# Patient Record
Sex: Male | Born: 1960 | Race: White | Hispanic: No | Marital: Married | Smoking: Never smoker
Health system: Southern US, Community
[De-identification: ages and names within clinical notes are randomized; demographics above are authoritative.]

## PROBLEM LIST (undated history)

## (undated) DIAGNOSIS — Z8481 Family history of carrier of genetic disease: Secondary | ICD-10-CM

## (undated) DIAGNOSIS — Z807 Family history of other malignant neoplasms of lymphoid, hematopoietic and related tissues: Secondary | ICD-10-CM

## (undated) DIAGNOSIS — Z801 Family history of malignant neoplasm of trachea, bronchus and lung: Secondary | ICD-10-CM

## (undated) DIAGNOSIS — C801 Malignant (primary) neoplasm, unspecified: Secondary | ICD-10-CM

## (undated) DIAGNOSIS — Z808 Family history of malignant neoplasm of other organs or systems: Secondary | ICD-10-CM

## (undated) DIAGNOSIS — Z803 Family history of malignant neoplasm of breast: Secondary | ICD-10-CM

## (undated) DIAGNOSIS — I10 Essential (primary) hypertension: Secondary | ICD-10-CM

## (undated) DIAGNOSIS — Z85820 Personal history of malignant melanoma of skin: Secondary | ICD-10-CM

## (undated) HISTORY — DX: Family history of malignant neoplasm of breast: Z80.3

## (undated) HISTORY — DX: Family history of other malignant neoplasms of lymphoid, hematopoietic and related tissues: Z80.7

## (undated) HISTORY — DX: Family history of malignant neoplasm of trachea, bronchus and lung: Z80.1

## (undated) HISTORY — DX: Family history of malignant neoplasm of other organs or systems: Z80.8

## (undated) HISTORY — PX: ABDOMINAL SURGERY: SHX537

## (undated) HISTORY — DX: Personal history of malignant melanoma of skin: Z85.820

## (undated) HISTORY — DX: Family history of carrier of genetic disease: Z84.81

---

## 1998-02-07 ENCOUNTER — Encounter: Payer: Self-pay | Admitting: Emergency Medicine

## 1998-02-07 ENCOUNTER — Inpatient Hospital Stay (HOSPITAL_COMMUNITY): Admission: EM | Admit: 1998-02-07 | Discharge: 1998-02-13 | Payer: Self-pay | Admitting: Emergency Medicine

## 2002-08-26 ENCOUNTER — Ambulatory Visit (HOSPITAL_COMMUNITY): Admission: RE | Admit: 2002-08-26 | Discharge: 2002-08-26 | Payer: Self-pay | Admitting: Gastroenterology

## 2002-08-26 ENCOUNTER — Encounter: Payer: Self-pay | Admitting: Gastroenterology

## 2010-08-09 ENCOUNTER — Other Ambulatory Visit: Payer: Self-pay | Admitting: General Surgery

## 2010-08-09 DIAGNOSIS — R599 Enlarged lymph nodes, unspecified: Secondary | ICD-10-CM

## 2010-08-11 ENCOUNTER — Other Ambulatory Visit: Payer: Self-pay | Admitting: General Surgery

## 2010-08-11 ENCOUNTER — Ambulatory Visit
Admission: RE | Admit: 2010-08-11 | Discharge: 2010-08-11 | Disposition: A | Discharge status: Home / Self Care | Payer: Managed Care, Other (non HMO) | Source: Ambulatory Visit | Attending: General Surgery | Admitting: General Surgery

## 2010-08-11 ENCOUNTER — Other Ambulatory Visit: Payer: Self-pay | Admitting: Diagnostic Radiology

## 2010-08-11 DIAGNOSIS — R599 Enlarged lymph nodes, unspecified: Secondary | ICD-10-CM

## 2010-08-24 ENCOUNTER — Encounter: Payer: Managed Care, Other (non HMO) | Admitting: Hematology and Oncology

## 2010-08-31 ENCOUNTER — Encounter (HOSPITAL_BASED_OUTPATIENT_CLINIC_OR_DEPARTMENT_OTHER): Payer: Managed Care, Other (non HMO) | Admitting: Hematology and Oncology

## 2010-08-31 ENCOUNTER — Other Ambulatory Visit: Payer: Self-pay | Admitting: Hematology and Oncology

## 2010-08-31 DIAGNOSIS — C7A8 Other malignant neuroendocrine tumors: Secondary | ICD-10-CM

## 2010-08-31 DIAGNOSIS — C7A1 Malignant poorly differentiated neuroendocrine tumors: Secondary | ICD-10-CM

## 2010-08-31 LAB — CBC WITH DIFFERENTIAL/PLATELET
BASO%: 1.3 % (ref 0.0–2.0)
EOS%: 2.6 % (ref 0.0–7.0)
LYMPH%: 33.9 % (ref 14.0–49.0)
MCHC: 34 g/dL (ref 32.0–36.0)
MCV: 87.1 fL (ref 79.3–98.0)
MONO%: 11 % (ref 0.0–14.0)
Platelets: 344 10*3/uL (ref 140–400)
RBC: 5.13 10*6/uL (ref 4.20–5.82)
RDW: 14.7 % — ABNORMAL HIGH (ref 11.0–14.6)
nRBC: 0 % (ref 0–0)

## 2010-09-02 ENCOUNTER — Other Ambulatory Visit (HOSPITAL_COMMUNITY): Payer: Managed Care, Other (non HMO)

## 2010-09-03 LAB — COMPREHENSIVE METABOLIC PANEL
ALT: 32 U/L (ref 0–53)
AST: 34 U/L (ref 0–37)
Alkaline Phosphatase: 62 U/L (ref 39–117)
Creatinine, Ser: 0.94 mg/dL (ref 0.40–1.50)
Sodium: 138 mEq/L (ref 135–145)
Total Bilirubin: 0.7 mg/dL (ref 0.3–1.2)

## 2010-09-03 LAB — LACTATE DEHYDROGENASE: LDH: 162 U/L (ref 94–250)

## 2010-09-06 ENCOUNTER — Other Ambulatory Visit: Payer: Self-pay | Admitting: Hematology and Oncology

## 2010-09-07 ENCOUNTER — Other Ambulatory Visit (HOSPITAL_COMMUNITY): Payer: Managed Care, Other (non HMO)

## 2010-09-09 LAB — 5 HIAA, QUANTITATIVE, URINE, 24 HOUR
5-HIAA, 24 Hr Urine: 5 mg/24 h (ref <=6.0)
Volume, Urine-5HIAA: 2800 mL/24 h

## 2010-09-10 ENCOUNTER — Other Ambulatory Visit (HOSPITAL_COMMUNITY): Payer: Managed Care, Other (non HMO)

## 2010-09-21 ENCOUNTER — Other Ambulatory Visit: Payer: Self-pay | Admitting: Hematology and Oncology

## 2010-09-21 ENCOUNTER — Other Ambulatory Visit: Payer: Self-pay | Admitting: Gastroenterology

## 2010-09-21 DIAGNOSIS — D3A8 Other benign neuroendocrine tumors: Secondary | ICD-10-CM

## 2010-09-27 ENCOUNTER — Ambulatory Visit
Admission: RE | Admit: 2010-09-27 | Discharge: 2010-09-27 | Disposition: A | Discharge status: Home / Self Care | Payer: Managed Care, Other (non HMO) | Source: Ambulatory Visit | Attending: Hematology and Oncology | Admitting: Hematology and Oncology

## 2010-09-27 DIAGNOSIS — D3A8 Other benign neuroendocrine tumors: Secondary | ICD-10-CM

## 2010-09-27 MED ORDER — IOHEXOL 300 MG/ML  SOLN
125.0000 mL | Freq: Once | INTRAMUSCULAR | Status: AC | PRN
  Administered 2010-09-27: 125 mL via INTRAVENOUS

## 2014-07-23 ENCOUNTER — Other Ambulatory Visit: Payer: Self-pay | Admitting: Gastroenterology

## 2017-06-18 ENCOUNTER — Encounter (HOSPITAL_COMMUNITY): Payer: Self-pay

## 2017-06-18 ENCOUNTER — Emergency Department (HOSPITAL_COMMUNITY): Payer: Managed Care, Other (non HMO)

## 2017-06-18 ENCOUNTER — Other Ambulatory Visit: Payer: Self-pay

## 2017-06-18 ENCOUNTER — Observation Stay (HOSPITAL_COMMUNITY)
Admission: EM | Admit: 2017-06-18 | Discharge: 2017-06-19 | Disposition: A | Discharge status: Home / Self Care | Payer: Managed Care, Other (non HMO) | Attending: Cardiology | Admitting: Cardiology

## 2017-06-18 DIAGNOSIS — I25110 Atherosclerotic heart disease of native coronary artery with unstable angina pectoris: Principal | ICD-10-CM | POA: Insufficient documentation

## 2017-06-18 DIAGNOSIS — Z8249 Family history of ischemic heart disease and other diseases of the circulatory system: Secondary | ICD-10-CM

## 2017-06-18 DIAGNOSIS — Z7982 Long term (current) use of aspirin: Secondary | ICD-10-CM | POA: Diagnosis not present

## 2017-06-18 DIAGNOSIS — I200 Unstable angina: Secondary | ICD-10-CM | POA: Diagnosis not present

## 2017-06-18 DIAGNOSIS — Z79899 Other long term (current) drug therapy: Secondary | ICD-10-CM | POA: Insufficient documentation

## 2017-06-18 DIAGNOSIS — I10 Essential (primary) hypertension: Secondary | ICD-10-CM | POA: Insufficient documentation

## 2017-06-18 HISTORY — DX: Malignant (primary) neoplasm, unspecified: C80.1

## 2017-06-18 HISTORY — DX: Essential (primary) hypertension: I10

## 2017-06-18 LAB — BASIC METABOLIC PANEL
Anion gap: 11 (ref 5–15)
BUN: 14 mg/dL (ref 6–20)
CO2: 24 mmol/L (ref 22–32)
Calcium: 9.2 mg/dL (ref 8.9–10.3)
Chloride: 102 mmol/L (ref 101–111)
Creatinine, Ser: 1.01 mg/dL (ref 0.61–1.24)
GFR calc Af Amer: >60 mL/min (ref >60)
GFR calc non Af Amer: >60 mL/min (ref >60)
Glucose, Bld: 102 mg/dL — ABNORMAL HIGH (ref 65–99)
Potassium: 4.1 mmol/L (ref 3.5–5.1)
Sodium: 137 mmol/L (ref 135–145)

## 2017-06-18 LAB — TROPONIN I
Troponin I: <0.03 ng/mL (ref <0.03)
Troponin I: <0.03 ng/mL (ref <0.03)

## 2017-06-18 LAB — CBC
HCT: 43.6 % (ref 39.0–52.0)
Hemoglobin: 14.9 g/dL (ref 13.0–17.0)
MCH: 33.1 pg (ref 26.0–34.0)
MCHC: 34.2 g/dL (ref 30.0–36.0)
MCV: 96.9 fL (ref 78.0–100.0)
Platelets: 341 10*3/uL (ref 150–400)
RBC: 4.5 MIL/uL (ref 4.22–5.81)
RDW: 13.5 % (ref 11.5–15.5)
WBC: 5.1 10*3/uL (ref 4.0–10.5)

## 2017-06-18 LAB — I-STAT TROPONIN, ED: Troponin i, poc: 0 ng/mL (ref 0.00–0.08)

## 2017-06-18 LAB — HEPARIN LEVEL (UNFRACTIONATED): HEPARIN UNFRACTIONATED: 0.46 [IU]/mL (ref 0.30–0.70)

## 2017-06-18 MED ORDER — NITROGLYCERIN 0.4 MG SL SUBL: 0.4000 mg | SUBLINGUAL_TABLET | SUBLINGUAL | Status: DC | PRN

## 2017-06-18 MED ORDER — LISINOPRIL 2.5 MG PO TABS
2.5000 mg | ORAL_TABLET | Freq: Every day | ORAL | Status: DC
  Administered 2017-06-18: 2.5 mg via ORAL
  Filled 2017-06-18 (×2): qty 1

## 2017-06-18 MED ORDER — ACETAMINOPHEN 325 MG PO TABS: 650.0000 mg | ORAL_TABLET | ORAL | Status: DC | PRN

## 2017-06-18 MED ORDER — ATORVASTATIN CALCIUM 80 MG PO TABS
80.0000 mg | ORAL_TABLET | Freq: Every day | ORAL | Status: DC
  Administered 2017-06-18: 80 mg via ORAL
  Filled 2017-06-18 (×2): qty 1

## 2017-06-18 MED ORDER — ASPIRIN EC 81 MG PO TBEC: 81.0000 mg | DELAYED_RELEASE_TABLET | Freq: Every day | ORAL | Status: DC

## 2017-06-18 MED ORDER — HEPARIN (PORCINE) IN NACL 100-0.45 UNIT/ML-% IJ SOLN
1200.0000 [IU]/h | INTRAMUSCULAR | Status: DC
  Administered 2017-06-18 – 2017-06-19 (×2): 1200 [IU]/h via INTRAVENOUS
  Filled 2017-06-18 (×2): qty 250

## 2017-06-18 MED ORDER — SODIUM CHLORIDE 0.9% FLUSH
3.0000 mL | Freq: Two times a day (BID) | INTRAVENOUS | Status: DC
  Administered 2017-06-19: 3 mL via INTRAVENOUS

## 2017-06-18 MED ORDER — SODIUM CHLORIDE 0.9 % IV SOLN
INTRAVENOUS | Status: DC
  Administered 2017-06-19: 06:00:00 via INTRAVENOUS

## 2017-06-18 MED ORDER — ONDANSETRON HCL 4 MG/2ML IJ SOLN: 4.0000 mg | Freq: Four times a day (QID) | INTRAMUSCULAR | Status: DC | PRN

## 2017-06-18 MED ORDER — HEPARIN BOLUS VIA INFUSION
4000.0000 [IU] | Freq: Once | INTRAVENOUS | Status: AC
  Administered 2017-06-18: 4000 [IU] via INTRAVENOUS
  Filled 2017-06-18: qty 4000

## 2017-06-18 MED ORDER — SODIUM CHLORIDE 0.9% FLUSH: 3.0000 mL | INTRAVENOUS | Status: DC | PRN

## 2017-06-18 MED ORDER — ASPIRIN 81 MG PO CHEW
324.0000 mg | CHEWABLE_TABLET | Freq: Once | ORAL | Status: DC
  Filled 2017-06-18: qty 4

## 2017-06-18 MED ORDER — ASPIRIN 81 MG PO CHEW
81.0000 mg | CHEWABLE_TABLET | ORAL | Status: AC
  Administered 2017-06-19: 81 mg via ORAL
  Filled 2017-06-18: qty 1

## 2017-06-18 MED ORDER — SODIUM CHLORIDE 0.9 % IV SOLN: 250.0000 mL | INTRAVENOUS | Status: DC | PRN

## 2017-06-18 NOTE — Progress Notes (Signed)
Peter Ford for heparin Indication: chest pain/ACS  No Known Allergies  Patient Measurements: Height: 5\' 11"  (180.3 cm) Weight: 202 lb 1.6 oz (91.7 kg) IBW/kg (Calculated) : 75.3 Heparin Dosing Weight: 93 kg  Assessment: 57 yo M presents with CP, dizziness, and diaphoresis. Pharmacy consulted to start heparin for ACS. CBC stable. No bleed documented. Initial heparin level therapeutic.  Goal of Therapy:  Heparin level 0.3-0.7 units/ml Monitor platelets by anticoagulation protocol: Yes   Plan:  Continue heparin gtt at 1,200 units/hr Confirmatory level with AM labs Monitor daily heparin level, CBC, s/s of bleed  [name], PharmD, BCPS Clinical Pharmacist 06/18/2017 9:44 PM

## 2017-06-18 NOTE — H&P (Addendum)
Patient ID: Peter Ford MRN: [id number], DOB/AGE: 1961/03/05   Admit date: 06/18/2017   Primary Physician: No primary care provider on file. Primary Cardiologist: new patient  Pt. Profile:  Chest pain  Problem List  Past Medical History:  Diagnosis Date  . Cancer ([place])   . Hypertension     Past Surgical History:  Procedure Laterality Date  . ABDOMINAL SURGERY       Allergies  No Known Allergies  HPI  Patient is a 57 y.o. male with a PMHx of hypertension who was admitted to [facility] on 06/18/2017 for evaluation of chest pain. The patient is very active, going to a gym regularly 3x/week and has noticed for the first time earlier this week that he gets SOB after exercise. Yesterday he was doing sone light garden work and developed diaphoresis and retrosternal chest pressure. Today he developed chest pressure while at rest.  No dizziness or syncope, no LE edema, no orthopnea or PND.  Father had multiple MIs and stents, the first one in his 29', ultimately undergoing CABG. He states that has never had elevated cholesterol. Never smoked.  Home Medications  Prior to Admission medications   Not on File    Family History  No family history on file.  Social History  Social History   Socioeconomic History  . Marital status: Single    Spouse name: Not on file  . Number of children: Not on file  . Years of education: Not on file  . Highest education level: Not on file  Social Needs  . Financial resource strain: Not on file  . Food insecurity - worry: Not on file  . Food insecurity - inability: Not on file  . Transportation needs - medical: Not on file  . Transportation needs - non-medical: Not on file  Occupational History  . Not on file  Tobacco Use  . Smoking status: Never Smoker  . Smokeless tobacco: Never Used  Substance and Sexual Activity  . Alcohol use: Yes    Comment: 1 drink per day  . Drug use: No  . Sexual activity: Not on file  Other Topics  Concern  . Not on file  Social History Narrative  . Not on file     Review of Systems General:  No chills, fever, night sweats or weight changes.  Cardiovascular:  No chest pain, dyspnea on exertion, edema, orthopnea, palpitations, paroxysmal nocturnal dyspnea. Dermatological: No rash, lesions/masses Respiratory: No cough, dyspnea Urologic: No hematuria, dysuria Abdominal:   No nausea, vomiting, diarrhea, bright red blood per rectum, melena, or hematemesis Neurologic:  No visual changes, wkns, changes in mental status. All other systems reviewed and are otherwise negative except as noted above.  Physical Exam  Blood pressure 128/90, pulse (!) 55, temperature 99.6 F (37.6 C), temperature source Oral, resp. rate 15, height 5\' 11"  (1.803 m), weight 205 lb (93 kg), SpO2 99 %.  General: Pleasant, NAD Psych: Normal affect. Neuro: Alert and oriented X 3. Moves all extremities spontaneously. HEENT: Normal  Neck: Supple without bruits or JVD. Lungs:  Resp regular and unlabored, CTA. Heart: RRR no s3, s4, or murmurs. Abdomen: Soft, non-tender, non-distended, BS + x 4.  Extremities: No clubbing, cyanosis or edema. DP/PT/Radials 2+ and equal bilaterally.  Labs  No results for input(s): CKTOTAL, CKMB, TROPONINI in the last 72 hours. Lab Results  Component Value Date   WBC 5.1 06/18/2017   HGB 14.9 06/18/2017   HCT 43.6 06/18/2017   MCV 96.9 06/18/2017  PLT 341 06/18/2017    Recent Labs  Lab 06/18/17 1113  NA 137  K 4.1  CL 102  CO2 24  BUN 14  CREATININE 1.01  CALCIUM 9.2  GLUCOSE 102*   No results found for: CHOL, HDL, LDLCALC, TRIG No results found for: DDIMER Invalid input(s): POCBNP   Radiology/Studies  Dg Chest 2 View  Result Date: 06/18/2017 CLINICAL DATA:  57 year old male with history of chest pain, dizziness, diaphoresis and nausea. EXAM: CHEST  2 VIEW COMPARISON:  Chest CT 09/27/2010. FINDINGS: Lung volumes are normal. No consolidative airspace disease. No  pleural effusions. No pneumothorax. No pulmonary nodule or mass noted. Pulmonary vasculature and the cardiomediastinal silhouette are within normal limits. 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 IMPRESSION: No radiographic evidence of acute cardiopulmonary disease. Electronically Signed   By: Vinnie Langton M.D.   On: 06/18/2017 11:36   Echocardiogram - none  ECG: SR, Q wave in the inferior leads    ASSESSMENT AND PLAN  1. Unstable angina - continue iv Heparin - start asa 81 mg po daily and atorvastatin 80 mg po daily - no BB as he is bradycardic - start lisinopril 2.5 mg po daily - order an echocardiogram   2. HTN  - start lisinopril 2.5 mg po daily  DVT PPX - Heparin drip  Signed, Ena Dawley, MD, [facility] 06/18/2017, 12:43 PM

## 2017-06-18 NOTE — ED Provider Notes (Addendum)
[facility] EMERG[facility]Y DEPARTMENT Provider Note   CSN: [id number] Arrival date & time: 06/18/17  1105     History   Chief Complaint No chief complaint on file.   HPI Peter Ford is a 57 y.o. male.  HPI   57 year old male with chest pain.  Patient has been having intermittent dyspnea and chest tightness for the past several weeks.  He has noticed this while working out at [employer].  Symptoms improved with rest.  Yesterday he was cutting some brush in his yard and had similar symptoms.  Again there is resolved with rest.  Today he was sitting on the couch and began having substernal chest pain, dyspnea, diaphoresis and nausea.  The symptoms slowly subsided over the course of 15-20 minutes.  He is currently symptom-free.  He denies any known cardiac history.  He does not have a cardiologist.  Past Medical History:  Diagnosis Date  . Cancer ([place])   . Hypertension     There are no active problems to display for this patient.   Past Surgical History:  Procedure Laterality Date  . ABDOMINAL SURGERY         Home Medications    Prior to Admission medications   Not on File    Family History No family history on file.  Social History Social History   Tobacco Use  . Smoking status: Never Smoker  . Smokeless tobacco: Never Used  Substance Use Topics  . Alcohol use: Yes    Comment: 1 drink per day  . Drug use: No     Allergies   Patient has no known allergies.   Review of Systems Review of Systems  All systems reviewed and negative, other than as noted in HPI.  Physical Exam Updated Vital Signs BP 128/90   Pulse (!) 55   Temp 99.6 F (37.6 C) (Oral)   Resp 15   Ht 5\' 11"  (1.803 m)   Wt 93 kg (205 lb)   SpO2 99%   BMI 28.59 kg/m   Physical Exam  Constitutional: He appears well-developed and well-nourished. No distress.  HENT:  Head: Normocephalic and atraumatic.  Eyes: Conjunctivae are normal. Right eye exhibits no  discharge. Left eye exhibits no discharge.  Neck: Neck supple.  Cardiovascular: Normal rate, regular rhythm and normal heart sounds. Exam reveals no gallop and no friction rub.  No murmur heard. Pulmonary/Chest: Effort normal and breath sounds normal. No respiratory distress.  Abdominal: Soft. He exhibits no distension. There is no tenderness.  Musculoskeletal: He exhibits no edema or tenderness.  Lower extremities symmetric as compared to each other. No calf tenderness. Negative Homan's. No palpable cords.  Neurological: He is alert.  Skin: Skin is warm and dry.  Psychiatric: He has a normal mood and affect. His behavior is normal. Thought content normal.  Nursing note and vitals reviewed.    ED Treatments / Results  Labs (all labs ordered are listed, but only abnormal results are displayed) Labs Reviewed  BASIC METABOLIC PANEL - Abnormal; Notable for the following components:      Result Value   Glucose, Bld 102 (*)    All other components within normal limits  CBC  TROPONIN I  TROPONIN I  TROPONIN I  HEPARIN LEVEL (UNFRACTIONATED)  I-STAT TROPONIN, ED    EKG  EKG Interpretation  Date/Time:  Sunday June 18 2017 11:10:41 EST Ventricular Rate:  62 PR Interval:  154 QRS Duration: 76 QT Interval:  394 QTC Calculation: 399  R Axis:   -27 Text Interpretation:  Normal sinus rhythm Low voltage QRS Inferior infarct , age undetermined Abnormal ECG No old tracing to compare Confirmed by Virgel Manifold [phone]) on 06/18/2017 11:54:15 AM       Radiology Dg Chest 2 View  Result Date: 06/18/2017 CLINICAL DATA:  57 year old male with history of chest pain, dizziness, diaphoresis and nausea. EXAM: CHEST  2 VIEW COMPARISON:  Chest CT 09/27/2010. FINDINGS: Lung volumes are normal. No consolidative airspace disease. No pleural effusions. No pneumothorax. No pulmonary nodule or mass noted. Pulmonary vasculature and the cardiomediastinal silhouette are within normal limits. 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 IMPRESSION: No radiographic evidence of acute cardiopulmonary disease. Electronically Signed   By: Vinnie Langton M.D.   On: 06/18/2017 11:36    Procedures Procedures (including critical care time)  CRITICAL CARE Performed by: Virgel Manifold Total critical care time: 35 minutes Critical care time was exclusive of separately billable procedures and treating other patients. Critical care was necessary to treat or prevent imminent or life-threatening deterioration. Critical care was time spent personally by me on the following activities: development of treatment plan with patient and/or surrogate as well as nursing, discussions with consultants, evaluation of patient's response to treatment, examination of patient, obtaining history from patient or surrogate, ordering and performing treatments and interventions, ordering and review of laboratory studies, ordering and review of radiographic studies, pulse oximetry and re-evaluation of patient's condition.   Medications Ordered in ED Medications  aspirin chewable tablet 324 mg (not administered)  heparin bolus via infusion 4,000 Units (not administered)  heparin ADULT infusion 100 units/mL (25000 units/264mL sodium chloride 0.45%) (not administered)     Initial Impression / Assessment and Plan / ED Course  I have reviewed the triage vital signs and the nursing notes.  Pertinent labs & imaging results that were available during my care of the patient were reviewed by me and considered in my medical decision making (see chart for details).     57 year old male with symptoms concerning for unstable angina.  Currently symptom-free.  Aspirin.  Heparin.  Cardiology consulted.  Final Clinical Impressions(s) / ED Diagnoses   Final diagnoses:  Unstable angina [facility])    ED Discharge Orders    None       Virgel Manifold, MD 06/18/17 1501    Virgel Manifold,  MD 07/06/17 [phone]

## 2017-06-18 NOTE — ED Triage Notes (Signed)
PT called EMS for CP, dizziness, diaphoresis, nausea yesterday and occurring again today. Pt reports episode began after cooking breakfast this morning.   PT received 4mg  zofran pta. No pain at this time.

## 2017-06-18 NOTE — Progress Notes (Signed)
ANTICOAGULATION CONSULT NOTE - Initial Consult  Pharmacy Consult for heparin Indication: chest pain/ACS  No Known Allergies  Patient Measurements: Height: 5\' 11"  (180.3 cm) Weight: 205 lb (93 kg) IBW/kg (Calculated) : 75.3 Heparin Dosing Weight: 93 kg  Assessment: 57 yo M presents with CP, dizziness, and diaphoresis. Pharmacy consulted to start heparin for ACS. First troponin negative. CBC stable.  Goal of Therapy:  Heparin level 0.3-0.7 units/ml Monitor platelets by anticoagulation protocol: Yes   Plan:  Give heparin 4,000 unit bolus Start heparin gtt at 1,200 units/hr Monitor daily heparin level, CBC, s/s of bleed   Peter Ford J 06/18/2017,12:30 PM

## 2017-06-18 NOTE — ED Notes (Signed)
Cards MD at bedside

## 2017-06-19 ENCOUNTER — Encounter (HOSPITAL_COMMUNITY): Payer: Self-pay | Admitting: Cardiology

## 2017-06-19 ENCOUNTER — Encounter (HOSPITAL_COMMUNITY)
Admission: EM | Disposition: A | Discharge status: Home / Self Care | Payer: Self-pay | Source: Home / Self Care | Attending: Emergency Medicine

## 2017-06-19 ENCOUNTER — Observation Stay (HOSPITAL_BASED_OUTPATIENT_CLINIC_OR_DEPARTMENT_OTHER): Payer: Managed Care, Other (non HMO)

## 2017-06-19 DIAGNOSIS — R079 Chest pain, unspecified: Secondary | ICD-10-CM

## 2017-06-19 HISTORY — PX: INTRAVASCULAR PRESSURE WIRE/FFR STUDY: CATH118243

## 2017-06-19 HISTORY — PX: LEFT HEART CATH AND CORONARY ANGIOGRAPHY: CATH118249

## 2017-06-19 LAB — CBC
HEMATOCRIT: 45.4 % (ref 39.0–52.0)
HEMOGLOBIN: 15.2 g/dL (ref 13.0–17.0)
MCH: 32.7 pg (ref 26.0–34.0)
MCHC: 33.5 g/dL (ref 30.0–36.0)
MCV: 97.6 fL (ref 78.0–100.0)
Platelets: 335 10*3/uL (ref 150–400)
RBC: 4.65 MIL/uL (ref 4.22–5.81)
RDW: 14.1 % (ref 11.5–15.5)
WBC: 6.8 10*3/uL (ref 4.0–10.5)

## 2017-06-19 LAB — LIPID PANEL
Cholesterol: 209 mg/dL — ABNORMAL HIGH (ref 0–200)
HDL: 49 mg/dL (ref >40)
LDL CALC: 134 mg/dL — AB (ref 0–99)
TRIGLYCERIDES: 132 mg/dL (ref <150)
Total CHOL/HDL Ratio: 4.3 RATIO
VLDL: 26 mg/dL (ref 0–40)

## 2017-06-19 LAB — ECHOCARDIOGRAM COMPLETE
Ao-asc: 35 cm
CHL CUP MV DEC (S): 271
E/e' ratio: 7.79
EWDT: 271 ms
FS: 39 % (ref 28–44)
Height: 71 in
IVS/LV PW RATIO, ED: 0.96
LA ID, A-P, ES: 44 mm
LA vol A4C: 55.2 ml
LA vol index: 35.1 mL/m2
LADIAMINDEX: 2.05 cm/m2
LAVOL: 75.5 mL
LEFT ATRIUM END SYS DIAM: 44 mm
LV E/e'average: 7.79
LV e' LATERAL: 10.2 cm/s
LVEEMED: 7.79
LVOT area: 3.8 cm2
LVOT diameter: 22 mm
MVAP: 2.78 cm2
MVPG: 3 mmHg
MVPKAVEL: 62.6 m/s
MVPKEVEL: 79.5 m/s
MVSPHT: 79 ms
PV Reg vel dias: 61.7 cm/s
PW: 11.5 mm — AB (ref 0.6–1.1)
RV LATERAL S' VELOCITY: 12.5 cm/s
TAPSE: 22.9 mm
TDI e' lateral: 10.2
TDI e' medial: 9.14
Weight: 3208 oz

## 2017-06-19 LAB — HEPARIN LEVEL (UNFRACTIONATED): Heparin Unfractionated: 0.48 IU/mL (ref 0.30–0.70)

## 2017-06-19 LAB — POCT ACTIVATED CLOTTING TIME
ACTIVATED CLOTTING TIME: 290 s
Activated Clotting Time: 335 seconds
Activated Clotting Time: 593 seconds

## 2017-06-19 LAB — BASIC METABOLIC PANEL
Anion gap: 8 (ref 5–15)
BUN: 12 mg/dL (ref 6–20)
CALCIUM: 9.1 mg/dL (ref 8.9–10.3)
CO2: 25 mmol/L (ref 22–32)
Chloride: 104 mmol/L (ref 101–111)
Creatinine, Ser: 1.03 mg/dL (ref 0.61–1.24)
GFR calc Af Amer: >60 mL/min (ref >60)
GLUCOSE: 113 mg/dL — AB (ref 65–99)
POTASSIUM: 4.1 mmol/L (ref 3.5–5.1)
Sodium: 137 mmol/L (ref 135–145)

## 2017-06-19 LAB — HEMOGLOBIN A1C
Hgb A1c MFr Bld: 5.4 % (ref 4.8–5.6)
Mean Plasma Glucose: 108.28 mg/dL

## 2017-06-19 LAB — TROPONIN I: Troponin I: <0.03 ng/mL (ref <0.03)

## 2017-06-19 LAB — PROTIME-INR
INR: 1.02
PROTHROMBIN TIME: 13.3 s (ref 11.4–15.2)

## 2017-06-19 LAB — HIV ANTIBODY (ROUTINE TESTING W REFLEX): HIV SCREEN 4TH GENERATION: NONREACTIVE

## 2017-06-19 SURGERY — LEFT HEART CATH AND CORONARY ANGIOGRAPHY
Anesthesia: LOCAL

## 2017-06-19 MED ORDER — HEPARIN (PORCINE) IN NACL 2-0.9 UNIT/ML-% IJ SOLN
INTRAMUSCULAR | Status: AC
  Filled 2017-06-19: qty 500

## 2017-06-19 MED ORDER — IOPAMIDOL (ISOVUE-370) INJECTION 76%
INTRAVENOUS | Status: DC | PRN
  Administered 2017-06-19: 140 mL via INTRAVENOUS

## 2017-06-19 MED ORDER — ONDANSETRON HCL 4 MG/2ML IJ SOLN: 4.0000 mg | Freq: Four times a day (QID) | INTRAMUSCULAR | Status: DC | PRN

## 2017-06-19 MED ORDER — IOPAMIDOL (ISOVUE-370) INJECTION 76%
INTRAVENOUS | Status: AC
  Filled 2017-06-19: qty 50

## 2017-06-19 MED ORDER — MIDAZOLAM HCL 2 MG/2ML IJ SOLN
INTRAMUSCULAR | Status: AC
  Filled 2017-06-19: qty 2

## 2017-06-19 MED ORDER — SODIUM CHLORIDE 0.9% FLUSH: 3.0000 mL | INTRAVENOUS | Status: DC | PRN

## 2017-06-19 MED ORDER — IOPAMIDOL (ISOVUE-370) INJECTION 76%
INTRAVENOUS | Status: AC
  Filled 2017-06-19: qty 100

## 2017-06-19 MED ORDER — FENTANYL CITRATE (PF) 100 MCG/2ML IJ SOLN
INTRAMUSCULAR | Status: AC
  Filled 2017-06-19: qty 2

## 2017-06-19 MED ORDER — SODIUM CHLORIDE 0.9 % IV SOLN
INTRAVENOUS | Status: AC
  Administered 2017-06-19: 10:00:00 via INTRAVENOUS

## 2017-06-19 MED ORDER — NITROGLYCERIN 1 MG/10 ML FOR IR/CATH LAB
INTRA_ARTERIAL | Status: DC | PRN
  Administered 2017-06-19: 100 ug via INTRACORONARY

## 2017-06-19 MED ORDER — NITROGLYCERIN 0.4 MG SL SUBL: 0.4000 mg | SUBLINGUAL_TABLET | SUBLINGUAL | 3 refills | Status: AC | PRN

## 2017-06-19 MED ORDER — ASPIRIN 81 MG PO TBEC: 81.0000 mg | DELAYED_RELEASE_TABLET | Freq: Every day | ORAL | 6 refills | Status: AC

## 2017-06-19 MED ORDER — ISOSORBIDE MONONITRATE ER 30 MG PO TB24: 15.0000 mg | ORAL_TABLET | Freq: Every day | ORAL | 3 refills | Status: DC

## 2017-06-19 MED ORDER — SODIUM CHLORIDE 0.9 % IV SOLN
INTRAVENOUS | Status: AC | PRN
  Administered 2017-06-19: 999 mL/h via INTRAVENOUS

## 2017-06-19 MED ORDER — MIDAZOLAM HCL 2 MG/2ML IJ SOLN
INTRAMUSCULAR | Status: DC | PRN
  Administered 2017-06-19: 0.5 mg via INTRAVENOUS
  Administered 2017-06-19: 1 mg via INTRAVENOUS

## 2017-06-19 MED ORDER — HEPARIN (PORCINE) IN NACL 2-0.9 UNIT/ML-% IJ SOLN
INTRAMUSCULAR | Status: AC | PRN
  Administered 2017-06-19 (×4): 500 mL

## 2017-06-19 MED ORDER — LIDOCAINE HCL (PF) 1 % IJ SOLN
INTRAMUSCULAR | Status: DC | PRN
  Administered 2017-06-19: 2 mL

## 2017-06-19 MED ORDER — SODIUM CHLORIDE 0.9 % IV SOLN: 250.0000 mL | INTRAVENOUS | Status: DC | PRN

## 2017-06-19 MED ORDER — HEPARIN (PORCINE) IN NACL 2-0.9 UNIT/ML-% IJ SOLN
INTRAMUSCULAR | Status: AC
  Filled 2017-06-19: qty 1000

## 2017-06-19 MED ORDER — LIDOCAINE HCL (PF) 1 % IJ SOLN
INTRAMUSCULAR | Status: DC | PRN
  Administered 2017-06-19: 7.5 mL via INTRA_ARTERIAL

## 2017-06-19 MED ORDER — NITROGLYCERIN 1 MG/10 ML FOR IR/CATH LAB
INTRA_ARTERIAL | Status: AC
  Filled 2017-06-19: qty 10

## 2017-06-19 MED ORDER — VERAPAMIL HCL 2.5 MG/ML IV SOLN
INTRAVENOUS | Status: AC
  Filled 2017-06-19: qty 2

## 2017-06-19 MED ORDER — ADENOSINE 12 MG/4ML IV SOLN
INTRAVENOUS | Status: AC
  Filled 2017-06-19: qty 16

## 2017-06-19 MED ORDER — HEPARIN SODIUM (PORCINE) 1000 UNIT/ML IJ SOLN
INTRAMUSCULAR | Status: DC | PRN
  Administered 2017-06-19: 2000 [IU] via INTRAVENOUS
  Administered 2017-06-19: 5000 [IU] via INTRAVENOUS
  Administered 2017-06-19: 3000 [IU] via INTRAVENOUS

## 2017-06-19 MED ORDER — FENTANYL CITRATE (PF) 100 MCG/2ML IJ SOLN
INTRAMUSCULAR | Status: DC | PRN
  Administered 2017-06-19: 50 ug via INTRAVENOUS
  Administered 2017-06-19: 12.5 ug via INTRAVENOUS

## 2017-06-19 MED ORDER — ACETAMINOPHEN 325 MG PO TABS: 650.0000 mg | ORAL_TABLET | ORAL | Status: DC | PRN

## 2017-06-19 MED ORDER — ISOSORBIDE MONONITRATE ER 30 MG PO TB24
15.0000 mg | ORAL_TABLET | Freq: Every day | ORAL | Status: DC
  Administered 2017-06-19: 15 mg via ORAL
  Filled 2017-06-19: qty 1

## 2017-06-19 MED ORDER — ATORVASTATIN CALCIUM 80 MG PO TABS: 80.0000 mg | ORAL_TABLET | Freq: Every day | ORAL | 6 refills | Status: DC

## 2017-06-19 MED ORDER — ADENOSINE (DIAGNOSTIC) 140MCG/KG/MIN
INTRAVENOUS | Status: DC | PRN
  Administered 2017-06-19: 140 ug/kg/min via INTRAVENOUS

## 2017-06-19 MED ORDER — HEPARIN SODIUM (PORCINE) 1000 UNIT/ML IJ SOLN
INTRAMUSCULAR | Status: AC
  Filled 2017-06-19: qty 1

## 2017-06-19 MED ORDER — SODIUM CHLORIDE 0.9% FLUSH: 3.0000 mL | Freq: Two times a day (BID) | INTRAVENOUS | Status: DC

## 2017-06-19 SURGICAL SUPPLY — 21 items
CATH INFINITI 5 FR JL3.5 (CATHETERS) ×2 IMPLANT
CATH INFINITI JR4 5F (CATHETERS) ×2 IMPLANT
CATH LAUNCHER 6FR AL1 (CATHETERS) ×1 IMPLANT
CATH LAUNCHER 6FR EBU3.5 (CATHETERS) ×2 IMPLANT
CATH LAUNCHER 6FR JR4 (CATHETERS) ×2 IMPLANT
CATH MICROCATH NAVVUS (MICROCATHETER) ×1 IMPLANT
CATH VISTA GUIDE 6FR JR4 (CATHETERS) ×2 IMPLANT
CATHETER LAUNCHER 6FR AL1 (CATHETERS) ×2
DEVICE RAD COMP TR BAND LRG (VASCULAR PRODUCTS) ×2 IMPLANT
GLIDESHEATH SLEND A-KIT 6F 22G (SHEATH) ×2 IMPLANT
GUIDEWIRE INQWIRE 1.5J.035X260 (WIRE) ×1 IMPLANT
GUIDEWIRE PRESSURE COMET II (WIRE) ×4 IMPLANT
INQWIRE 1.5J .035X260CM (WIRE) ×2
KIT ESSENTIALS PG (KITS) ×2 IMPLANT
KIT HEART LEFT (KITS) ×2 IMPLANT
KIT HEMO VALVE WATCHDOG (MISCELLANEOUS) ×2 IMPLANT
MICROCATHETER NAVVUS (MICROCATHETER) ×2
PACK CARDIAC CATHETERIZATION (CUSTOM PROCEDURE TRAY) ×2 IMPLANT
TRANSDUCER W/STOPCOCK (MISCELLANEOUS) ×2 IMPLANT
TUBING CIL FLEX 10 FLL-RA (TUBING) ×2 IMPLANT
WIRE COUGAR XT STRL 190CM (WIRE) ×2 IMPLANT

## 2017-06-19 NOTE — Discharge Instructions (Signed)
May return to work on 06/20/17 as long as patient refrains from any heavy lifting.

## 2017-06-19 NOTE — Progress Notes (Addendum)
Patient ambulated in hall post TR band removal. Site began to bleed and pressure was applied for 20 minutes. Gauze and tegaderm applied to site and site returned back to a level 0.   Dr. Virgina Jock made aware.

## 2017-06-19 NOTE — Progress Notes (Signed)
Patient already off floor in cath lab prior to the start of my shift.

## 2017-06-19 NOTE — Progress Notes (Signed)
Discharge teaching complete. Meds, diet, activity, follow up appointments and TRband teaching complete and all questions answered. Copy of instructions given to patient and prescriptions sent to pharmacy. Wife at bedside for teaching.

## 2017-06-19 NOTE — Progress Notes (Signed)
Patient back from cath lab in NAD. VS stable. TR band site clean, dry and intact.

## 2017-06-19 NOTE — Discharge Summary (Addendum)
Physician Discharge Summary  Patient ID: Peter Ford MRN: [id number] DOB/AGE: 12/26/1960 57 y.o.  Admit date: 06/18/2017 Discharge date: 06/19/2017  Admission Diagnoses:  Discharge Diagnoses:  Active Problems:   Unstable angina [facility])   Discharged Condition: good  Hospital Course:   Patient was admitted with symptoms concerning for unstable angina.  He underwent coronary angiogram that showed for negative moderate nonobstructive disease in LAD and RCA.  He was recommended aggressive medical management and risk stratification, including aspirin, high-dose statin, and low-dose Imdur.  I'll see the patient for outpatient follow-up.  Consults: None  Significant Diagnostic Studies: Labs Results for MORGON, PAMER (MRN [id number]) as of 06/19/2017 10:07  Ref. Range 06/19/2017 05:09  WBC Latest Ref Range: 4.0 - 10.5 K/uL 6.8  RBC Latest Ref Range: 4.22 - 5.81 MIL/uL 4.65  Hemoglobin Latest Ref Range: 13.0 - 17.0 g/dL 15.2  HCT Latest Ref Range: 39.0 - 52.0 % 45.4  MCV Latest Ref Range: 78.0 - 100.0 fL 97.6  MCH Latest Ref Range: 26.0 - 34.0 pg 32.7  MCHC Latest Ref Range: 30.0 - 36.0 g/dL 33.5  RDW Latest Ref Range: 11.5 - 15.5 % 14.1  Platelets Latest Ref Range: 150 - 400 K/uL 335   Results for MILLION, MAHARAJ (MRN [id number]) as of 06/19/2017 10:07  Ref. Range 06/19/2017 00:07 06/19/2017 97:98  BASIC METABOLIC PANEL Unknown  Rpt (A)  Sodium Latest Ref Range: 135 - 145 mmol/L  137  Potassium Latest Ref Range: 3.5 - 5.1 mmol/L  4.1  Chloride Latest Ref Range: 101 - 111 mmol/L  104  CO2 Latest Ref Range: 22 - 32 mmol/L  25  Glucose Latest Ref Range: 65 - 99 mg/dL  113 (H)  Mean Plasma Glucose Latest Units: mg/dL  108.28  BUN Latest Ref Range: 6 - 20 mg/dL  12  Creatinine Latest Ref Range: 0.61 - 1.24 mg/dL  1.03  Calcium Latest Ref Range: 8.9 - 10.3 mg/dL  9.1  Anion gap Latest Ref Range: 5 - 15   8  GFR, Est Non African American Latest Ref Range: >60 mL/min  >60  GFR,  Est African American Latest Ref Range: >60 mL/min  >60  Troponin I Latest Ref Range: <0.03 ng/mL <0.03   Total CHOL/HDL Ratio Latest Units: RATIO  4.3  Cholesterol Latest Ref Range: 0 - 200 mg/dL  209 (H)  HDL Cholesterol Latest Ref Range: >40 mg/dL  49  LDL (calc) Latest Ref Range: 0 - 99 mg/dL  134 (H)  Triglycerides Latest Ref Range: <150 mg/dL  132  VLDL Latest Ref Range: 0 - 40 mg/dL  26    Procedures   INTRAVASCULAR PRESSURE WIRE/FFR STUDY  LEFT HEART CATH AND CORONARY ANGIOGRAPHY  Conclusion   LM: Normal LAD: Mid LAD 50% stemosis, FFR 0.81         Small diag 1 ostial 75% stenosis        Medium sized Diag 2 40% ostial stenosis LCx: No stenosis. TIMI 2 flow RCA: Prox-mid LRCA 50% stenosis, FFr 0.84  Normal LVEDP Normal LVEF, normal wall motion     Treatments:  Medical management  Discharge Exam: Blood pressure (!) 141/77, pulse (!) 48, temperature 98.1 F (36.7 C), temperature source Oral, resp. rate 15, height 5\' 11"  (1.803 m), weight 90.9 kg (200 lb 8 oz), SpO2 100 %. General: Pleasant, NAD Psych: Normal affect. Neuro: Alert and oriented X 3. Moves all extremities spontaneously. HEENT: Normal           Neck:  Supple without bruits or JVD. Lungs:  Resp regular and unlabored, CTA. Heart: RRR no s3, s4, or murmurs. Abdomen: Soft, non-tender, non-distended, BS + x 4.  Extremities: No clubbing, cyanosis or edema. DP/PT/Radials 2+ and equal bilaterally. Right radial site with no bleeding, hematoma. Good capillary refill.    Disposition: Home   Allergies as of 06/19/2017   No Known Allergies     Medication List    TAKE these medications   amLODipine 5 MG tablet Commonly known as:  NORVASC Take 5 mg by mouth daily.   aspirin 81 MG EC tablet Take 1 tablet (81 mg total) by mouth daily. Start taking on:  06/20/2017   atorvastatin 80 MG tablet Commonly known as:  LIPITOR Take 1 tablet (80 mg total) by mouth daily at 6 PM.   ibuprofen 200 MG tablet Commonly  known as:  ADVIL,MOTRIN Take 200 mg by mouth every 6 (six) hours as needed for moderate pain.   isosorbide mononitrate 30 MG 24 hr tablet Commonly known as:  IMDUR Take 0.5 tablets (15 mg total) by mouth daily.   lansoprazole 15 MG capsule Commonly known as:  PREVACID Take 15 mg by mouth daily as needed (GERD).   multivitamin with minerals Tabs tablet Take 1 tablet by mouth daily.   nitroGLYCERIN 0.4 MG SL tablet Commonly known as:  NITROSTAT Place 1 tablet (0.4 mg total) under the tongue every 5 (five) minutes as needed for chest pain.   telmisartan 40 MG tablet Commonly known as:  MICARDIS Take 40 mg by mouth daily.        F/u w/me on 06/26/2017 at 10:30 AM  Signed: Anicia Leuthold J Anis Cinelli 06/19/2017, 10:10 AM  Nigel Mormon, MD [facility] [place] Pager: [phone] Office: [phone] If no answer Cell [phone]

## 2017-06-19 NOTE — Progress Notes (Signed)
Patient discharged home via wheelchair with wife. ?

## 2017-06-19 NOTE — Interval H&P Note (Signed)
History and Physical Interval Note:  06/19/2017 7:15 AM  Peter Ford  has presented today for surgery, with the diagnosis of unstable angina  The various methods of treatment have been discussed with the patient and family. After consideration of risks, benefits and other options for treatment, the patient has consented to  Procedure(s): LEFT HEART CATH AND CORONARY ANGIOGRAPHY (N/A) as a surgical intervention .  The patient's history has been reviewed, patient examined, no change in status, stable for surgery.  I have reviewed the patient's chart and labs.  Questions were answered to the patient's satisfaction.    2016 Appropriate Use Criteria for Coronary Revascularization in Patients With Acute Coronary Syndrome NSTEMI/UA Intermediate Risk (TIMI Score 3-4)  NSTEMI/Unstable angina, stabilized patient at Intermediate Risk (TIMI Score 3-4) Link Here: http://dodson-rose.net/ Indication:  Revascularization by PCI or CABG of 1 or more arteries in a patient with NSTEMI or unstable angina with Stabilization after presentation Intermediate risk for clinical events  A (7) Indication: 16; Score 7     [name]

## 2017-06-19 NOTE — Progress Notes (Signed)
  Echocardiogram 2D Echocardiogram has been performed.  Larz Mark G Delora Gravatt 06/19/2017, 11:20 AM

## 2017-12-14 ENCOUNTER — Inpatient Hospital Stay: Payer: Managed Care, Other (non HMO)

## 2017-12-14 ENCOUNTER — Encounter: Payer: Self-pay | Admitting: Licensed Clinical Social Worker

## 2017-12-14 ENCOUNTER — Inpatient Hospital Stay: Payer: Managed Care, Other (non HMO) | Attending: Genetic Counselor | Admitting: Licensed Clinical Social Worker

## 2017-12-14 DIAGNOSIS — Z803 Family history of malignant neoplasm of breast: Secondary | ICD-10-CM | POA: Insufficient documentation

## 2017-12-14 DIAGNOSIS — Z801 Family history of malignant neoplasm of trachea, bronchus and lung: Secondary | ICD-10-CM | POA: Insufficient documentation

## 2017-12-14 DIAGNOSIS — Z8481 Family history of carrier of genetic disease: Secondary | ICD-10-CM

## 2017-12-14 DIAGNOSIS — Z808 Family history of malignant neoplasm of other organs or systems: Secondary | ICD-10-CM

## 2017-12-14 DIAGNOSIS — Z85820 Personal history of malignant melanoma of skin: Secondary | ICD-10-CM | POA: Insufficient documentation

## 2017-12-14 DIAGNOSIS — Z807 Family history of other malignant neoplasms of lymphoid, hematopoietic and related tissues: Secondary | ICD-10-CM | POA: Insufficient documentation

## 2017-12-14 NOTE — Progress Notes (Signed)
PRIMARY PROVIDER:  Merrilee Seashore, MD  PRIMARY REASON FOR VISIT:  1. Family history of breast cancer gene mutation in first degree relative   2. Personal history of malignant melanoma   3. Family history of breast cancer   4. Family history of melanoma   5. Family history of lung cancer   6. Family history of lymphoma     HISTORY OF PRESENT ILLNESS:   Peter Ford, a 57 y.o. male, was seen for a [facility] cancer genetics consultation due to his sister's positive genetic test result (ATM) and his personal and family history of cancer.  Peter Ford to clinic today to discuss the possibility of a hereditary predisposition to cancer, genetic testing, and to further clarify his future cancer risks, as well as potential cancer risks for family members.   In 2014, at the age of 51, Peter Ford was diagnosed with metastatic melanoma. He reports that his doctors could never find the actual melanoma and it was only diagnosed because of a lymph node. This was treated by the removal of many lymph nodes. He is currently disease free and doing well.   MEDICAL HISTORY:  Colonoscopy: yes; abnormal, multiple adenomas found on his 2012 and 2016 colonoscopies.  Prostate: annual screening.   Past Medical History:  Diagnosis Date  . Cancer ([place])   . Family history of breast cancer   . Family history of breast cancer gene mutation in first degree relative   . Family history of lung cancer   . Family history of lymphoma   . Family history of melanoma   . Hypertension   . Personal history of malignant melanoma     Past Surgical History:  Procedure Laterality Date  . ABDOMINAL SURGERY    . INTRAVASCULAR PRESSURE WIRE/FFR STUDY N/A 06/19/2017   Procedure: INTRAVASCULAR PRESSURE WIRE/FFR STUDY;  Surgeon: Nigel Mormon, MD;  Location: [facility];  Service: Cardiovascular;  Laterality: N/A;  . LEFT HEART CATH AND CORONARY ANGIOGRAPHY N/A 06/19/2017   Procedure: LEFT  HEART CATH AND CORONARY ANGIOGRAPHY;  Surgeon: Nigel Mormon, MD;  Location: [facility];  Service: Cardiovascular;  Laterality: N/A;    Social History   Socioeconomic History  . Marital status: Single    Spouse name: Not on file  . Number of children: Not on file  . Years of education: Not on file  . Highest education level: Not on file  Occupational History  . Not on file  Social Needs  . Financial resource strain: Not on file  . Food insecurity:    Worry: Not on file    Inability: Not on file  . Transportation needs:    Medical: Not on file    Non-medical: Not on file  Tobacco Use  . Smoking status: Never Smoker  . Smokeless tobacco: Never Used  Substance and Sexual Activity  . Alcohol use: Yes    Comment: 1 drink per day  . Drug use: No  . Sexual activity: Not on file  Lifestyle  . Physical activity:    Days per week: Not on file    Minutes per session: Not on file  . Stress: Not on file  Relationships  . Social connections:    Talks on phone: Not on file    Gets together: Not on file    Attends religious service: Not on file    Active member of club or organization: Not on file    Attends meetings of clubs or organizations:  Not on file    Relationship status: Not on file  Other Topics Concern  . Not on file  Social History Narrative  . Not on file     FAMILY HISTORY:  We obtained a detailed, 4-generation family history.  Significant diagnoses are listed below: Family History  Problem Relation Age of Onset  . Lung cancer Mother 57  . Melanoma Mother        dx 55s  . Lung cancer Father 2  . Breast cancer Sister        Ivin Booty, ATM+  . Lymphoma Maternal Uncle 68  . Lymphoma Maternal Grandfather   . Lung cancer Paternal Grandmother   . Breast cancer Cousin 23  . Breast cancer Other 44  . Prostate cancer Other   . Melanoma Other        "in eye"    Peter Ford has one biological daughter. She has one son and is currently pregnant.  Peter Ford also has two stepsons. Peter Ford has one sister, Ivin Booty, who was recently seen in clinic and found to be ATM positive. She currently has breast cancer diagnosed at 57.   Peter Ford father was diagnosed with lung cancer at 67 and died at 23. He was a smoker. He had three brothers who all had children. No cancer history in PeterVigil's uncles or paternal cousins. Peter Ford paternal grandmother had lung cancer at 27 and is deceased. Peter Ford paternal grandfather is deceased.  Peter Ford mother is living at 20. She has a history of lung cancer at 65 and melanoma in her 36's. She has two brothers, one has a history of lymphoma and is living at 38. Peter Ford also has a maternal aunt. Peter Ford has a maternal cousin that was recently diagnosed with breast cancer at 51. Peter Ford maternal grandmother is deceased, and his maternal grandfather had lymphoma at 21 and is deceased. His maternal grandfather had a brother with prostate cancer, a sister with lung cancer, and a sister with breast cancer diagnosed at 55. His father, Peter Ford great grandfather, had melanoma "in his eye."   Peter Ford is unaware of previous family history of genetic testing for hereditary cancer risks. Patient's maternal ancestors are of caucasian descent, and paternal ancestors are of caucasian descent. There is no reported Ashkenazi Jewish ancestry. There is no known consanguinity.  GENETIC COUNSELING ASSESSMENT: Peter Ford is a 57 y.o. male with a family history of a Hereditary Cancer Predisposition Syndrome. We, therefore, discussed and recommended the following at today's visit.   DISCUSSION: We discussed that about 5-10% of breast cancer cases are hereditary. We discussed the ATM gene in detail along with cancer risks. We reviewed the characteristics, features and inheritance patterns of hereditary cancer syndromes. We also discussed genetic testing,  including the appropriate family members to test, the process of testing, insurance coverage and turn-around-time for results. We discussed the implications of a negative, positive and/or variant of uncertain significant result. Based on Peter Ford's personal and family history of melanoma, we offered a gene panel. Peter Ford expressed that he was mostly doing this for his daughter, and therefore was okay with doing the familial variant testing only.   The familial variant testing is done through [facility] laboratories and will only look for his sister's mutation, ATM [id number] (Splice acceptor). This test is done at no cost and therefore will not go through Mr. [employer].  We discussed that if he is found to have a mutation in one of  these genes, it may impact future medical management recommendations such as increased cancer screenings. A positive result could also have implications for the patient's family members.  A Negative result would mean we were unable to identify the mutation we found in his sister.  There could be mutations that are undetectable by current technology, or in genes not yet tested or identified to increase cancer risk.    We discussed the potential to find a Variant of Uncertain Significance or VUS.  These are variants that have not yet been identified as pathogenic or benign, and it is unknown if this variant is associated with increased cancer risk or if this is a normal finding.  Most VUS's are reclassified to benign or likely benign.   It should not be used to make medical management decisions. With time, we suspect the lab will determine the significance of any VUS's identified if any.   PLAN: After considering the risks, benefits, and limitations, Mr. Bethard  provided informed consent to pursue genetic testing and the blood sample was sent to [facility] for familial variant testing of the ATM gene. Results should be available within  approximately 2-3 weeks' time, at which point they will be disclosed by telephone to Mr. Waldron, as will any additional recommendations warranted by these results. Mr. Novello will receive a summary of his genetic counseling visit and a copy of his results once available. This information will also be available in Epic.   Based on Mr. Rodarte's family history, we recommended his maternal cousin, who was diagnosed with breast cancer at age 31, have genetic counseling and testing. Mr. Kemppainen will let us know if we can be of any assistance in coordinating genetic counseling and/or testing for this family member.   Lastly, we encouraged Mr. Redd to remain in contact with cancer genetics annually so that we can continuously update the family history and inform him of any changes in cancer genetics and testing that may be of benefit for this family.   Mr.  Selsor questions were answered to his satisfaction today. Our contact information was provided should additional questions or concerns arise. Thank you for the referral and allowing [place] to share in the care of your patient.   Epimenio Foot, MS Genetic Counselor  [email or web address] Phone: [phone]  The patient was seen for a total of 30 minutes of face-to-face genetic counseling.

## 2018-01-02 ENCOUNTER — Ambulatory Visit: Payer: Self-pay | Admitting: Licensed Clinical Social Worker

## 2018-01-02 ENCOUNTER — Telehealth: Payer: Self-pay | Admitting: Licensed Clinical Social Worker

## 2018-01-02 ENCOUNTER — Encounter: Payer: Self-pay | Admitting: Licensed Clinical Social Worker

## 2018-01-02 DIAGNOSIS — Z1379 Encounter for other screening for genetic and chromosomal anomalies: Secondary | ICD-10-CM

## 2018-01-02 DIAGNOSIS — Z8481 Family history of carrier of genetic disease: Secondary | ICD-10-CM

## 2018-01-02 DIAGNOSIS — Z801 Family history of malignant neoplasm of trachea, bronchus and lung: Secondary | ICD-10-CM

## 2018-01-02 DIAGNOSIS — Z803 Family history of malignant neoplasm of breast: Secondary | ICD-10-CM

## 2018-01-02 DIAGNOSIS — Z85820 Personal history of malignant melanoma of skin: Secondary | ICD-10-CM

## 2018-01-02 DIAGNOSIS — Z807 Family history of other malignant neoplasms of lymphoid, hematopoietic and related tissues: Secondary | ICD-10-CM

## 2018-01-02 DIAGNOSIS — Z808 Family history of malignant neoplasm of other organs or systems: Secondary | ICD-10-CM

## 2018-01-02 NOTE — Progress Notes (Signed)
HPI:  [name] was previously seen in the [place] clinic on 12/14/2017 due to his sister's recent genetic testing, positive for ATM mutation, as well as a personal history of cancer and concerns regarding a hereditary predisposition to cancer. Please refer to our prior cancer genetics clinic note for more information regarding [name]'s medical, social and family histories, and our assessment and recommendations, at the time. [name] recent genetic test results were disclosed to him, as well as recommendations warranted by these results. These results and recommendations are discussed in more detail below.   FAMILY HISTORY:  We obtained a detailed, 4-generation family history.  Significant diagnoses are listed below: Family History  Problem Relation Age of Onset  . Lung cancer Mother 43  . Melanoma Mother        dx 35s  . Lung cancer Father 25  . Breast cancer Sister        Peter Ford, ATM+  . Lymphoma Maternal Uncle 68  . Lymphoma Maternal Grandfather   . Lung cancer Paternal Grandmother   . Breast cancer Cousin 4  . Breast cancer Other 44  . Prostate cancer Other   . Melanoma Other        "in eye"   [name] has one biological daughter. She has one son and is currently pregnant. [name] also has two stepsons. [name] has one sister, Peter Ford, who was recently seen in clinic and found to be ATM positive. She currently has breast cancer diagnosed at 49.   [name] father was diagnosed with lung cancer at 38 and died at 15. He was a smoker. He had three brothers who all had children. No cancer history in Mr.Peter Ford's uncles or paternal cousins. [name]'s paternal grandmother had lung cancer at 51 and is deceased. [name]'s paternal grandfather is deceased.  [name] mother is living at 33. She has a history of lung cancer at 65 and melanoma in her 46's. She has two brothers, one has a history of  lymphoma and is living at 87. Mr.Peter Ford also has a maternal aunt. Mr.Peter Ford has a maternal cousin that was recently diagnosed with breast cancer at 12. [name]'s maternal grandmother is deceased, and his maternal grandfather had lymphoma at 43 and is deceased. His maternal grandfather had a brother with prostate cancer, a sister with lung cancer, and a sister with breast cancer diagnosed at 63. His father, [name] great grandfather, had melanoma "in his eye."   [name] is unaware of previous family history of genetic testing for hereditary cancer risks. Patient's maternal ancestors are of caucasian descent, and paternal ancestors are of caucasian descent. There is no reported Ashkenazi Jewish ancestry. There is no known consanguinity.  GENETIC TEST RESULTS: Genetic testing performed through [facility] reported out on 01/01/2018 showed no pathogenic mutations. This testing looked only at the ATM gene.  The test report will be scanned into EPIC and will be located under the Molecular Pathology section of the Results Review tab. A portion of the result report is included below for reference.    We recommended [name] pursue testing for the familial hereditary cancer gene mutation called ATM [id number]. [name]'s test was normal and did not reveal the familial mutation. We call this result a true negative result because the cancer-causing mutation was identified in [name]'s family, and he did not inherit it.  Given this negative result, [name] chances of developing ATM-related cancers are the same as they  are in the general population.   ADDITIONAL GENETIC TESTING: We discussed with [name] that there are other genes that are associated with increased cancer risk that can be analyzed. The laboratories that offer this testing look at these additional genes via a hereditary cancer gene panel. Should [name] wish to pursue additional  genetic testing, we are happy to discuss and coordinate this testing, at any time.    CANCER SCREENING RECOMMENDATIONS: [name] test result is considered negative (normal).  This means that we did not identify the familial ATM mutation in him.   While reassuring, this does not definitively rule out a hereditary predisposition to cancer. It is still possible that he has a mutation in another gene we did not look at. Therefore, it is recommended he continue to follow the cancer management and screening guidelines provided by his oncology and primary healthcare provider. An individual's cancer risk is not determined by genetic test results alone.  Overall cancer risk assessment includes additional factors such as personal medical history, family history, etc.  These should be used to make a personalized plan for cancer prevention and surveillance.    RECOMMENDATIONS FOR FAMILY MEMBERS:  Relatives in this family might be at some increased risk of developing cancer, over the general population risk, simply due to the family history of cancer.  We recommended women in this family have a yearly mammogram beginning at age 70, or 48 years younger than the earliest onset of cancer, an annual clinical breast exam, and perform monthly breast self-exams. Women in this family should also have a gynecological exam as recommended by their primary provider. All family members should have a colonoscopy by age 19 (or as directed by their doctors).  All family members should inform their physicians about the family history of cancer so their doctors can make the most appropriate screening recommendations for them.   In addition we recommended [name]'s mother have genetic counseling and testing to help identify which side of the family the ATM mutation is coming from. [name] will let us know if we can be of any assistance in coordinating genetic counseling and/or testing for this family member.    FOLLOW-UP: Lastly, we discussed with [name] that cancer genetics is a rapidly advancing field and it is possible that new genetic tests will be appropriate for him and/or his family members in the future. We encouraged him to remain in contact with cancer genetics on an annual basis so we can update his personal and family histories and let him know of advances in cancer genetics that may benefit this family.   Our contact number was provided. Mr. Hitz questions were answered to his satisfaction, and he knows he is welcome to call us at anytime with additional questions or concerns.   Epimenio Foot, MS Genetic Counselor [email or web address] Phone: [phone]

## 2018-01-02 NOTE — Telephone Encounter (Signed)
Revealed negative genetic testing.  He did not inherit the familial ATM mutation. However, genetic testing is not perfect, and cannot definitively rule out a hereditary cause for his own cancer history. It will be important for him to keep in contact with genetics to learn if any additional testing may be needed in the future.

## 2018-08-02 ENCOUNTER — Encounter: Payer: Self-pay | Admitting: Cardiology

## 2018-08-02 ENCOUNTER — Ambulatory Visit (INDEPENDENT_AMBULATORY_CARE_PROVIDER_SITE_OTHER): Payer: Managed Care, Other (non HMO) | Admitting: Cardiology

## 2018-08-02 ENCOUNTER — Other Ambulatory Visit: Payer: Self-pay

## 2018-08-02 DIAGNOSIS — I2510 Atherosclerotic heart disease of native coronary artery without angina pectoris: Secondary | ICD-10-CM | POA: Diagnosis not present

## 2018-08-02 NOTE — Progress Notes (Signed)
Telephone visit note  Subjective:   Peter Ford, male    DOB: Jun 16, 1960, 58 y.o.   MRN: [id number]   I connected with the patient on 08/02/18 by a telephone call and verified that I am speaking with the correct person using two identifiers.     I offered the patient a video enabled application for a virtual visit. Unfortunately, this could not be accomplished due to technical difficulties/lack of video enabled phone/computer. I discussed the limitations of evaluation and management by telemedicine and the availability of in person appointments. The patient expressed understanding and agreed to proceed.   This visit type was conducted due to national recommendations for restrictions regarding the COVID-19 Pandemic (e.g. social distancing).  This format is felt to be most appropriate for this patient at this time.  All issues noted in this document were discussed and addressed.  No physical exam was performed (except for noted visual exam findings with Tele health visits).  The patient has consented to conduct a Tele health visit and understands insurance will be billed.    Chief complaint:  Coronary artery disease  HPI  58 year old Caucasian male with hypertension, hyperlipidemia, FFR negative moderate nonobstructive disease in LAD and RCA (Cath 06/2017).  Since his last visit, patient has done well. He denies chest pain, shortness of breath, palpitations, leg edema, orthopnea, PND, TIA/syncope.  Past Medical History:  Diagnosis Date  . Cancer ([place])   . Family history of breast cancer   . Family history of breast cancer gene mutation in first degree relative   . Family history of lung cancer   . Family history of lymphoma   . Family history of melanoma   . Hypertension   . Personal history of malignant melanoma      Past Surgical History:  Procedure Laterality Date  . ABDOMINAL SURGERY    . INTRAVASCULAR PRESSURE WIRE/FFR STUDY N/A 06/19/2017   Procedure:  INTRAVASCULAR PRESSURE WIRE/FFR STUDY;  Surgeon: Nigel Mormon, MD;  Location: [facility];  Service: Cardiovascular;  Laterality: N/A;  . LEFT HEART CATH AND CORONARY ANGIOGRAPHY N/A 06/19/2017   Procedure: LEFT HEART CATH AND CORONARY ANGIOGRAPHY;  Surgeon: Nigel Mormon, MD;  Location: [facility];  Service: Cardiovascular;  Laterality: N/A;     Social History   Socioeconomic History  . Marital status: Single    Spouse name: Not on file  . Number of children: Not on file  . Years of education: Not on file  . Highest education level: Not on file  Occupational History  . Not on file  Social Needs  . Financial resource strain: Not on file  . Food insecurity:    Worry: Not on file    Inability: Not on file  . Transportation needs:    Medical: Not on file    Non-medical: Not on file  Tobacco Use  . Smoking status: Never Smoker  . Smokeless tobacco: Never Used  Substance and Sexual Activity  . Alcohol use: Yes    Comment: 1 drink per day  . Drug use: No  . Sexual activity: Not on file  Lifestyle  . Physical activity:    Days per week: Not on file    Minutes per session: Not on file  . Stress: Not on file  Relationships  . Social connections:    Talks on phone: Not on file    Gets together: Not on file    Attends religious service: Not on file  Active member of club or organization: Not on file    Attends meetings of clubs or organizations: Not on file    Relationship status: Not on file  . Intimate partner violence:    Fear of current or ex partner: Not on file    Emotionally abused: Not on file    Physically abused: Not on file    Forced sexual activity: Not on file  Other Topics Concern  . Not on file  Social History Narrative  . Not on file     Family History  Problem Relation Age of Onset  . Lung cancer Mother 79  . Melanoma Mother        dx 53s  . Lung cancer Father 43  . Breast cancer Sister        Ivin Booty, ATM+  .  Lymphoma Maternal Uncle 68  . Lymphoma Maternal Grandfather   . Lung cancer Paternal Grandmother   . Breast cancer Cousin 23  . Breast cancer Other 44  . Prostate cancer Other   . Melanoma Other        "in eye"     Current Outpatient Medications on File Prior to Visit  Medication Sig Dispense Refill  . amLODipine (NORVASC) 5 MG tablet Take 5 mg by mouth daily.  1  . aspirin EC 81 MG EC tablet Take 1 tablet (81 mg total) by mouth daily. 60 tablet 6  . atorvastatin (LIPITOR) 80 MG tablet Take 1 tablet (80 mg total) by mouth daily at 6 PM. 60 tablet 6  . ibuprofen (ADVIL,MOTRIN) 200 MG tablet Take 200 mg by mouth every 6 (six) hours as needed for moderate pain.    . isosorbide mononitrate (IMDUR) 30 MG 24 hr tablet Take 0.5 tablets (15 mg total) by mouth daily. 30 tablet 3  . lansoprazole (PREVACID) 15 MG capsule Take 15 mg by mouth daily as needed (GERD).    . Multiple Vitamin (MULTIVITAMIN WITH MINERALS) TABS tablet Take 1 tablet by mouth daily.    . nitroGLYCERIN (NITROSTAT) 0.4 MG SL tablet Place 1 tablet (0.4 mg total) under the tongue every 5 (five) minutes as needed for chest pain. 30 tablet 3  . telmisartan (MICARDIS) 40 MG tablet Take 40 mg by mouth daily.  1   No current facility-administered medications on file prior to visit.     Cardiovascular studies:  Cath 06/19/2017: LM: Normal LAD: Mid LAD 50% stemosis, FFR 0.81         Small diag 1 ostial 75% stenosis        Medium sized Diag 2 40% ostial stenosis LCx: No stenosis. TIMI 2 flow RCA: Prox-mid LRCA 50% stenosis, FFr 0.84   Normal LVEDP Normal LVEF, normal wall motion  Hospital echocardiogram 06/19/2017: Normal size LV. EF 60-65%. Normal diastolic function Mild LA dilatation Trace MR, trace TR  Recent labs: Lipid panel 06/19/2017: Chol 209, TG 132,  HDL 49, LDL 134  Review of Systems  Constitution: Negative for decreased appetite, malaise/fatigue, weight gain and weight loss.  HENT: Negative for  congestion.   Eyes: Negative for visual disturbance.  Cardiovascular: Negative for chest pain, dyspnea on exertion, leg swelling, palpitations and syncope.  Respiratory: Negative for shortness of breath.   Endocrine: Negative for cold intolerance.  Hematologic/Lymphatic: Does not bruise/bleed easily.  Skin: Negative for itching and rash.  Musculoskeletal: Negative for myalgias.  Gastrointestinal: Negative for abdominal pain, nausea and vomiting.  Genitourinary: Negative for dysuria.  Neurological: Negative for dizziness and weakness.  Psychiatric/Behavioral:  The patient is not nervous/anxious.   All other systems reviewed and are negative.        There were no vitals filed for this visit. (Measured by the patient using a home BP monitor)  Objective:     Physical exam: Not performed., as this is a telephone visit       Assessment & Recommendations:   58 year old Caucasian male with hypertension, hyperlipidemia, FFR negative moderate nonobstructive disease in LAD and RCA (Cath 06/2017).  CAD: No angina symptoms.  Conitnue aspirin, atorvastatin, amlodipine, Imdue  Hyperlipidemia: Continue Lipitor 80 mg. Will obtain lipid panel from PCP.    Nigel Mormon, MD [facility] Pager: [phone] Office: [phone] If no answer Cell [phone]

## 2018-10-09 ENCOUNTER — Other Ambulatory Visit: Payer: Self-pay

## 2018-10-09 DIAGNOSIS — E7800 Pure hypercholesterolemia, unspecified: Secondary | ICD-10-CM

## 2018-10-09 MED ORDER — ATORVASTATIN CALCIUM 80 MG PO TABS: 80.0000 mg | ORAL_TABLET | Freq: Every day | ORAL | 6 refills | Status: DC

## 2019-08-04 NOTE — Progress Notes (Signed)
     Subjective:   Peter Ford, male    DOB: 1960-08-04, 59 y.o.   MRN: [id number]   Chief complaint:  Coronary artery disease  HPI  59 year old Caucasian male with hypertension, hyperlipidemia, FFR negative moderate nonobstructive disease in LAD and RCA (Cath 06/2017).  Since his last visit, patient has done well. He denies chest pain, shortness of breath, palpitations, leg edema, orthopnea, PND, TIA/syncope.   Current Outpatient Medications on File Prior to Visit  Medication Sig Dispense Refill  . amLODipine (NORVASC) 5 MG tablet Take 5 mg by mouth daily.  1  . aspirin EC 81 MG EC tablet Take 1 tablet (81 mg total) by mouth daily. 60 tablet 6  . atorvastatin (LIPITOR) 80 MG tablet Take 1 tablet (80 mg total) by mouth daily at 6 PM. (Patient taking differently: Take 40 mg by mouth daily at 6 PM. ) 90 tablet 6  . ibuprofen (ADVIL,MOTRIN) 200 MG tablet Take 200 mg by mouth every 6 (six) hours as needed for moderate pain.    Marland Kitchen lansoprazole (PREVACID) 15 MG capsule Take 15 mg by mouth daily as needed (GERD).    . Multiple Vitamin (MULTIVITAMIN WITH MINERALS) TABS tablet Take 1 tablet by mouth daily.    . nitroGLYCERIN (NITROSTAT) 0.4 MG SL tablet Place 1 tablet (0.4 mg total) under the tongue every 5 (five) minutes as needed for chest pain. 30 tablet 3  . telmisartan (MICARDIS) 40 MG tablet Take 40 mg by mouth daily.  1   No current facility-administered medications on file prior to visit.    Cardiovascular studies:  EKG 08/05/2019: Sinus rhythm 61 bpm. Left axis deviation. IVCD. Nonspecific T-abnormality.    Coronary angiogram 06/19/2017: LM: Normal LAD: Mid LAD 50% stemosis, FFR 0.81         Small diag 1 ostial 75% stenosis        Medium sized Diag 2 40% ostial stenosis LCx: No stenosis. TIMI 2 flow RCA: Prox-mid LRCA 50% stenosis, FFr 0.84   Normal LVEDP Normal LVEF, normal wall motion  Hospital echocardiogram 06/19/2017: Normal size LV. EF 60-65%. Normal  diastolic function Mild LA dilatation Trace MR, trace TR  Recent labs: Lipid panel 06/19/2017: Chol 209, TG 132,  HDL 49, LDL 134  Review of Systems  Cardiovascular: Negative for chest pain, dyspnea on exertion, leg swelling, palpitations and syncope.         Vitals:   08/05/19 1433  BP: 118/81  Pulse: 66  Resp: 18  Temp: (!) 97.2 F (36.2 C)  SpO2: 96%     Objective:     Physical exam: Physical Exam  Constitutional: He appears well-developed and well-nourished.  Neck: No JVD present.  Cardiovascular: Normal rate, regular rhythm, normal heart sounds and intact distal pulses.  No murmur heard. Pulmonary/Chest: Effort normal and breath sounds normal. He has no wheezes. He has no rales.  Musculoskeletal:        General: No edema.  Nursing note and vitals reviewed.         Assessment & Recommendations:   59 year-old Caucasian male with hypertension, hyperlipidemia, FFR negative moderate nonobstructive disease in LAD and RCA (Cath 06/2017).  CAD: No angina symptoms.  Conitnue aspirin, atorvastatin, amlodipine, Imdur  Hyperlipidemia: Continue Lipitor 80 mg. Will get labs from PCP. LDL goal <70  F?u in 1 year.   Peter Mormon, MD [facility] Pager: [phone] Office: [phone] If no answer Cell [phone]

## 2019-08-05 ENCOUNTER — Encounter: Payer: Self-pay | Admitting: Cardiology

## 2019-08-05 ENCOUNTER — Ambulatory Visit: Payer: Managed Care, Other (non HMO) | Admitting: Cardiology

## 2019-08-05 ENCOUNTER — Other Ambulatory Visit: Payer: Self-pay

## 2019-08-05 VITALS — BP 118/81 | HR 66 | Temp 97.2°F | Resp 18 | Ht 71.0 in | Wt 202.0 lb

## 2019-08-05 DIAGNOSIS — E782 Mixed hyperlipidemia: Secondary | ICD-10-CM

## 2019-08-05 DIAGNOSIS — I2510 Atherosclerotic heart disease of native coronary artery without angina pectoris: Secondary | ICD-10-CM

## 2019-10-17 IMAGING — DX DG CHEST 2V
2 series · 2 of 2 positions shown · non-contrast
Comparison: Chest CT 09/27/2010.

CLINICAL DATA: 57-year-old male with history of chest pain,
dizziness, diaphoresis and nausea.

EXAM:
CHEST  2 VIEW

[chest pa]
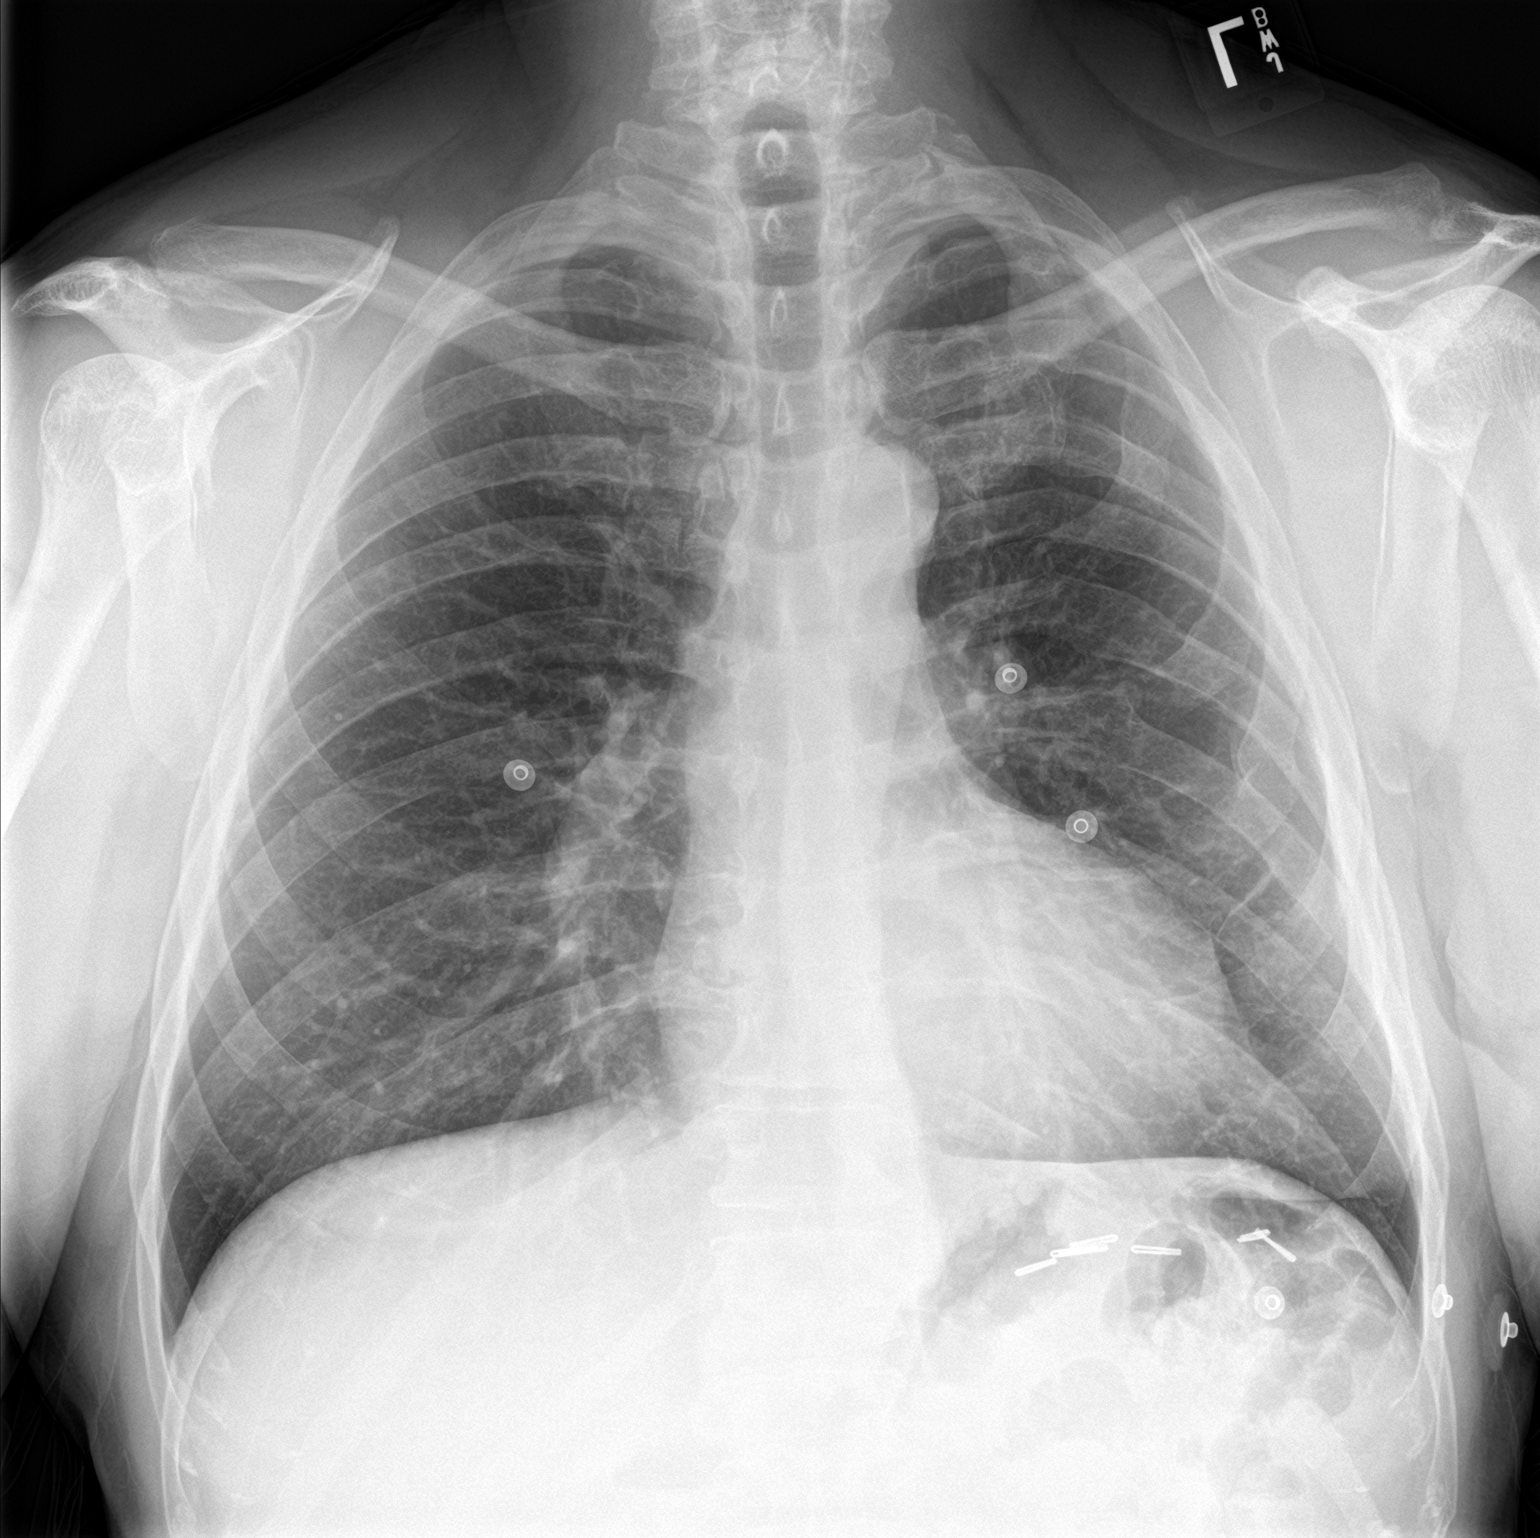

[chest lat]
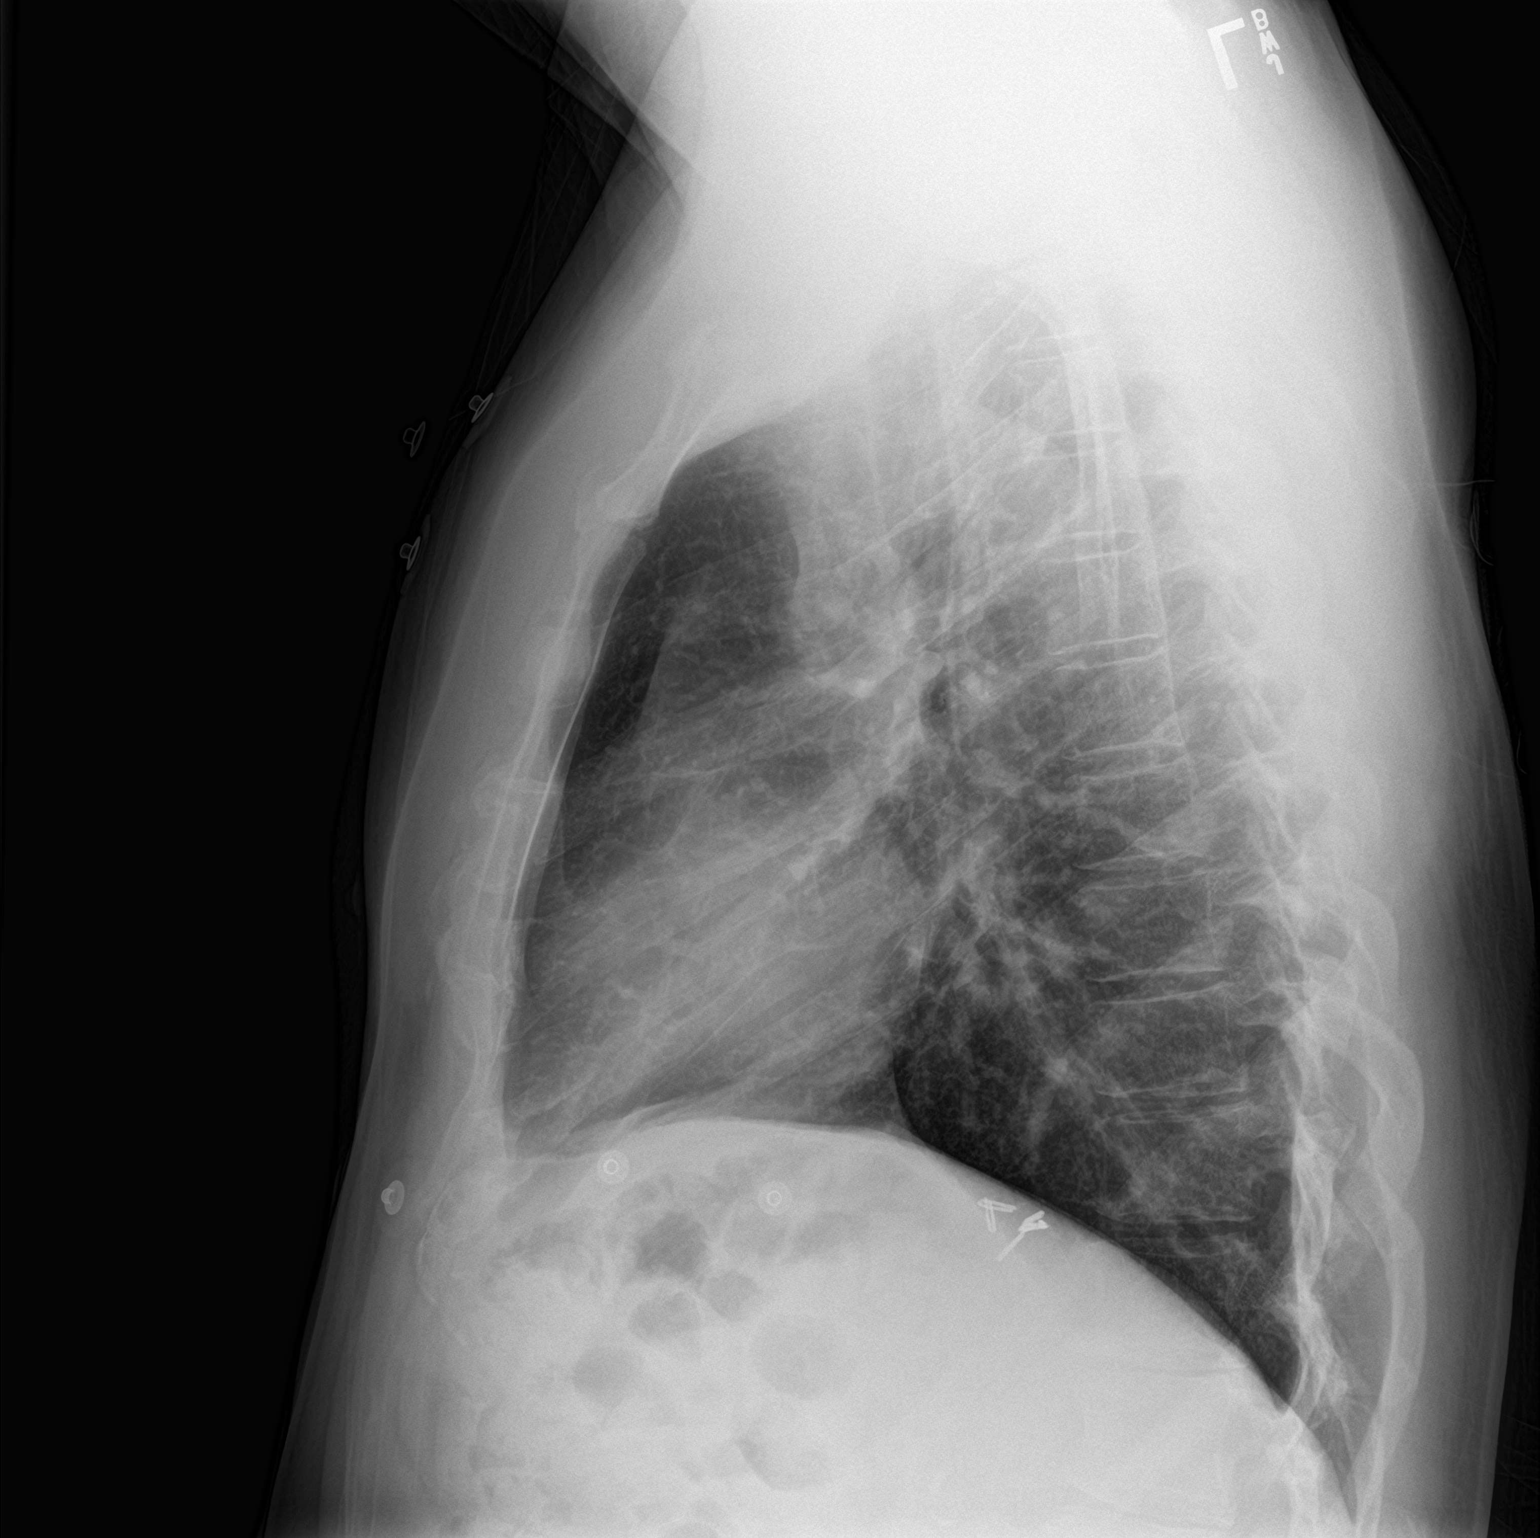

[2 of 2 positions shown; findings below may reference images not displayed]

FINDINGS: Lung volumes are normal. No consolidative airspace disease. No
pleural effusions. No pneumothorax. No pulmonary nodule or mass
noted. Pulmonary vasculature and the cardiomediastinal silhouette
are within normal limits. Surgical clips projecting over the left
upper quadrant of the abdomen from prior splenectomy.
IMPRESSION: No radiographic evidence of acute cardiopulmonary disease.

## 2019-10-22 ENCOUNTER — Other Ambulatory Visit: Payer: Self-pay | Admitting: Cardiology

## 2019-10-22 DIAGNOSIS — E7800 Pure hypercholesterolemia, unspecified: Secondary | ICD-10-CM

## 2020-08-06 ENCOUNTER — Ambulatory Visit: Payer: Managed Care, Other (non HMO) | Admitting: Cardiology

## 2020-08-06 ENCOUNTER — Encounter: Payer: Self-pay | Admitting: Cardiology

## 2020-08-06 ENCOUNTER — Other Ambulatory Visit: Payer: Self-pay

## 2020-08-06 VITALS — BP 114/74 | HR 63 | Temp 97.9°F | Resp 16 | Ht 71.0 in | Wt 207.0 lb

## 2020-08-06 DIAGNOSIS — I2510 Atherosclerotic heart disease of native coronary artery without angina pectoris: Secondary | ICD-10-CM

## 2020-08-06 NOTE — Progress Notes (Signed)
Subjective:   Peter Ford, male    DOB: 1960-11-02, 60 y.o.   MRN: [id number]   Chief complaint:  Coronary artery disease  HPI  60 year old Caucasian male with hypertension, hyperlipidemia, FFR negative moderate nonobstructive disease in LAD and RCA (Cath 06/2017).  Patient is doing very well. He denies chest pain, shortness of breath, palpitations, leg edema, orthopnea, PND, TIA/syncope. Reviewed recent lab results with the patient.  Current Outpatient Medications on File Prior to Visit  Medication Sig Dispense Refill  . amLODipine (NORVASC) 5 MG tablet Take 5 mg by mouth daily.  1  . aspirin EC 81 MG EC tablet Take 1 tablet (81 mg total) by mouth daily. 60 tablet 6  . atorvastatin (LIPITOR) 80 MG tablet TAKE 1 TABLET BY MOUTH DAILY AT 6PM 90 tablet 6  . ibuprofen (ADVIL,MOTRIN) 200 MG tablet Take 200 mg by mouth every 6 (six) hours as needed for moderate pain.    Marland Kitchen lansoprazole (PREVACID) 15 MG capsule Take 15 mg by mouth daily as needed (GERD).    . Multiple Vitamin (MULTIVITAMIN WITH MINERALS) TABS tablet Take 1 tablet by mouth daily.    . nitroGLYCERIN (NITROSTAT) 0.4 MG SL tablet Place 1 tablet (0.4 mg total) under the tongue every 5 (five) minutes as needed for chest pain. 30 tablet 3  . telmisartan (MICARDIS) 40 MG tablet Take 40 mg by mouth daily.  1   No current facility-administered medications on file prior to visit.    Cardiovascular studies:  EKG 08/07/2020: Sinus rhythm 58 bpm. Leftward axis Occasional PVC  Coronary angiogram 06/19/2017: LM: Normal LAD: Mid LAD 50% stemosis, FFR 0.81         Small diag 1 ostial 75% stenosis        Medium sized Diag 2 40% ostial stenosis LCx: No stenosis. TIMI 2 flow RCA: Prox-mid LRCA 50% stenosis, FFr 0.84   Normal LVEDP Normal LVEF, normal wall motion  Hospital echocardiogram 06/19/2017: Normal size LV. EF 60-65%. Normal diastolic function Mild LA dilatation Trace MR, trace TR  Recent  labs: 03/17/2020: Glucose 99, BUN/Cr 12/1.0. EGFR 73.  HbA1C 5.6% Chol 137, TG 62, HDL 58, LDL 66 TSH 1.6normal  Lipid panel 06/19/2017: Chol 209, TG 132,  HDL 49, LDL 134  Review of Systems  Cardiovascular: Negative for chest pain, dyspnea on exertion, leg swelling, palpitations and syncope.         Vitals:   08/06/20 1544  BP: 114/74  Pulse: 63  Resp: 16  Temp: 97.9 F (36.6 C)  SpO2: 98%     Objective:     Physical exam: Physical Exam Vitals and nursing note reviewed.  Constitutional:      Appearance: He is well-developed.  Neck:     Vascular: No JVD.  Cardiovascular:     Rate and Rhythm: Normal rate and regular rhythm.     Pulses: Normal pulses and intact distal pulses.     Heart sounds: Normal heart sounds. No murmur heard.   Pulmonary:     Effort: Pulmonary effort is normal.     Breath sounds: Normal breath sounds. No wheezing or rales.  Musculoskeletal:     Right lower leg: No edema.     Left lower leg: No edema.           Assessment & Recommendations:   60 year-old Caucasian male with hypertension, hyperlipidemia, FFR negative moderate nonobstructive disease in LAD and RCA (Cath 06/2017).  CAD: No angina symptoms.  Conitnue aspirin,  atorvastatin, amlodipine, Imdur  Hyperlipidemia: LDL 66. Continue Lipitor 80 mg.  He will continue f/u w/Dr. Ashby Dawes and see me on as needed basis  Nigel Mormon, MD [facility] [place] Pager: [phone] Office: [phone] If no answer Cell [phone]

## 2020-08-06 NOTE — Progress Notes (Deleted)
No eR or UC -ROS

## 2024-05-15 ENCOUNTER — Other Ambulatory Visit (HOSPITAL_COMMUNITY): Payer: Self-pay

## 2024-05-17 ENCOUNTER — Other Ambulatory Visit (HOSPITAL_COMMUNITY): Payer: Self-pay

## 2024-05-20 ENCOUNTER — Other Ambulatory Visit (HOSPITAL_COMMUNITY): Payer: Self-pay

## 2024-05-22 ENCOUNTER — Other Ambulatory Visit (HOSPITAL_COMMUNITY): Payer: Self-pay

## 2024-05-22 MED ORDER — AMLODIPINE BESYLATE 5 MG PO TABS: 5.0000 mg | ORAL_TABLET | Freq: Every day | ORAL | 3 refills | Status: AC

## 2024-05-22 MED ORDER — TELMISARTAN 40 MG PO TABS: 40.0000 mg | ORAL_TABLET | Freq: Every day | ORAL | 3 refills | Status: AC

## 2024-05-23 ENCOUNTER — Other Ambulatory Visit (HOSPITAL_COMMUNITY): Payer: Self-pay

## 2024-05-23 MED ORDER — ATORVASTATIN CALCIUM 80 MG PO TABS
40.0000 mg | ORAL_TABLET | Freq: Every day | ORAL | 3 refills | Status: AC
  Filled 2024-05-23: qty 45, 90d supply, fill #0
